# Patient Record
Sex: Male | Born: 1995 | Race: White | Hispanic: No | Marital: Single | Smoking: Current every day smoker
Health system: Southern US, Community
[De-identification: ages and names within clinical notes are randomized; demographics above are authoritative.]

---

## 2013-02-03 ENCOUNTER — Encounter (HOSPITAL_COMMUNITY): Payer: Self-pay | Admitting: Emergency Medicine

## 2013-02-03 ENCOUNTER — Emergency Department (INDEPENDENT_AMBULATORY_CARE_PROVIDER_SITE_OTHER): Payer: Self-pay

## 2013-02-03 ENCOUNTER — Emergency Department (INDEPENDENT_AMBULATORY_CARE_PROVIDER_SITE_OTHER)
Admission: EM | Admit: 2013-02-03 | Discharge: 2013-02-03 | Disposition: A | Discharge status: Home / Self Care | Payer: Self-pay | Source: Home / Self Care | Attending: Emergency Medicine | Admitting: Emergency Medicine

## 2013-02-03 DIAGNOSIS — S20219A Contusion of unspecified front wall of thorax, initial encounter: Secondary | ICD-10-CM

## 2013-02-03 DIAGNOSIS — S8000XA Contusion of unspecified knee, initial encounter: Secondary | ICD-10-CM

## 2013-02-03 MED ORDER — MELOXICAM 15 MG PO TABS: 15.0000 mg | ORAL_TABLET | Freq: Every day | ORAL | Status: DC

## 2013-02-03 MED ORDER — METHOCARBAMOL 500 MG PO TABS: 500.0000 mg | ORAL_TABLET | Freq: Three times a day (TID) | ORAL | Status: DC

## 2013-02-03 MED ORDER — TRAMADOL HCL 50 MG PO TABS: 100.0000 mg | ORAL_TABLET | Freq: Three times a day (TID) | ORAL | Status: DC | PRN

## 2013-02-03 NOTE — Discharge Instructions (Signed)
Chest Contusion °A chest contusion is a deep bruise on your chest area. Contusions are the result of an injury that caused bleeding under the skin. A chest contusion may involve bruising of the skin, muscles, or ribs. The contusion may turn blue, purple, or yellow. Minor injuries will give you a painless contusion, but more severe contusions may stay painful and swollen for a few weeks. °CAUSES  °A contusion is usually caused by a blow, trauma, or direct force to an area of the body. °SYMPTOMS  °· Swelling and redness of the injured area. °· Discoloration of the injured area. °· Tenderness and soreness of the injured area. °· Pain. °DIAGNOSIS  °The diagnosis can be made by taking a history and performing a physical exam. An X-ray, CT scan, or MRI may be needed to determine if there were any associated injuries, such as broken bones (fractures) or internal injuries. °TREATMENT  °Often, the best treatment for a chest contusion is resting, icing, and applying cold compresses to the injured area. Deep breathing exercises may be recommended to reduce the risk of pneumonia. Over-the-counter medicines may also be recommended for pain control. °HOME CARE INSTRUCTIONS  °· Put ice on the injured area. °· Put ice in a plastic bag. °· Place a towel between your skin and the bag. °· Leave the ice on for 15-20 minutes, 03-04 times a day. °· Only take over-the-counter or prescription medicines as directed by your caregiver. Your caregiver may recommend avoiding anti-inflammatory medicines (aspirin, ibuprofen, and naproxen) for 48 hours because these medicines may increase bruising. °· Rest the injured area. °· Perform deep-breathing exercises as directed by your caregiver. °· Stop smoking if you smoke. °· Do not lift objects over 5 pounds (2.3 kg) for 3 days or longer if recommended by your caregiver. °SEEK IMMEDIATE MEDICAL CARE IF:  °· You have increased bruising or swelling. °· You have pain that is getting worse. °· You have  difficulty breathing. °· You have dizziness, weakness, or fainting. °· You have blood in your urine or stool. °· You cough up or vomit blood. °· Your swelling or pain is not relieved with medicines. °MAKE SURE YOU:  °· Understand these instructions. °· Will watch your condition. °· Will get help right away if you are not doing well or get worse. °Document Released: 09/22/2000 Document Revised: 09/22/2011 Document Reviewed: 06/21/2011 °[employer]® Patient Information ©2014 [employer]. ° °
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
## 2013-10-27 ENCOUNTER — Emergency Department (HOSPITAL_COMMUNITY)
Admission: EM | Admit: 2013-10-27 | Discharge: 2013-10-27 | Disposition: A | Discharge status: Home / Self Care | Payer: Self-pay | Attending: Emergency Medicine | Admitting: Emergency Medicine

## 2013-10-27 ENCOUNTER — Encounter (HOSPITAL_COMMUNITY): Payer: Self-pay | Admitting: Emergency Medicine

## 2013-10-27 DIAGNOSIS — Z720 Tobacco use: Secondary | ICD-10-CM | POA: Insufficient documentation

## 2013-10-27 DIAGNOSIS — R319 Hematuria, unspecified: Secondary | ICD-10-CM | POA: Insufficient documentation

## 2013-10-27 LAB — URINALYSIS, ROUTINE W REFLEX MICROSCOPIC
Bilirubin Urine: NEGATIVE
GLUCOSE, UA: NEGATIVE mg/dL
Ketones, ur: NEGATIVE mg/dL
LEUKOCYTES UA: NEGATIVE
Nitrite: NEGATIVE
PH: 5.5 (ref 5.0–8.0)
PROTEIN: NEGATIVE mg/dL
SPECIFIC GRAVITY, URINE: 1.026 (ref 1.005–1.030)
Urobilinogen, UA: 0.2 mg/dL (ref 0.0–1.0)

## 2013-10-27 LAB — URINE MICROSCOPIC-ADD ON

## 2013-10-27 LAB — CBG MONITORING, ED: Glucose-Capillary: 82 mg/dL (ref 70–99)

## 2013-10-27 NOTE — ED Notes (Signed)
Per pt, states blood on tip of penis-states diesnt know if it is in urine-doesn't urinate often-last urination was yeaterday

## 2013-10-27 NOTE — ED Provider Notes (Addendum)
CSN: [id number]     Arrival date & time 10/27/13  1015 History   First MD Initiated Contact with Patient 10/27/13 1046     Chief Complaint  Patient presents with  . possible hematuria      (Consider location/radiation/quality/duration/timing/severity/associated sxs/prior Treatment) HPI Comments: Pt noticed a small amt of blood at the penile urethral meatus last night after masterbating.  He denies blood in the semen and denies rough sex.  This morning he noticed some dried blood and then a small amount of fresh oozing blood.  He denies penile pain, dysuria, abd pain, flank pain, fever or testicle pain or swelling.  This happened once when he was young but not again until now.  The history is provided by the patient.    History reviewed. No pertinent past medical history. History reviewed. No pertinent past surgical history. No family history on file. History  Substance Use Topics  . Smoking status: Current Every Day Smoker -- 1.00 packs/day    Types: Cigarettes  . Smokeless tobacco: Not on file  . Alcohol Use: Yes    Review of Systems  All other systems reviewed and are negative.     Allergies  Bee venom  Home Medications   Prior to Admission medications   Not on File   BP 134/69  Pulse 82  Temp(Src) 97.7 F (36.5 C) (Oral)  Resp 16  SpO2 100% Physical Exam  Nursing note and vitals reviewed. Constitutional: He appears well-developed and well-nourished. No distress.  HENT:  Head: Normocephalic and atraumatic.  Eyes: EOM are normal. Pupils are equal, round, and reactive to light.  Cardiovascular: Normal rate, normal heart sounds and intact distal pulses.   Pulmonary/Chest: Effort normal and breath sounds normal. No respiratory distress. He has no wheezes.  Abdominal: Soft. He exhibits no distension. There is no tenderness. There is no rebound, no guarding and no CVA tenderness.  Genitourinary: Testes normal and penis normal.  Dried blood at the urethral meatus     ED Course  Procedures (including critical care time) Labs Review Labs Reviewed  URINALYSIS, ROUTINE W REFLEX MICROSCOPIC - Abnormal; Notable for the following:    Hgb urine dipstick MODERATE (*)    All other components within normal limits  URINE MICROSCOPIC-ADD ON  CBG MONITORING, ED    Imaging Review No results found.   EKG Interpretation None      MDM   Final diagnoses:  Hematuria    Patient presents to the seeing blood on the tip of his penis that started last night after masturbation. He denies any blood in his semen. He denies any hematuria, dysuria, flank pain. He last had sex one week ago without pain or knowledge of blood in his semen. This morning he had some dried blood at urethral meatus. He denies any injury, lesions or abnormality to his male genitalia.  UA pending  12:07 PM UA with blood only.  No signs of infection.  Pt given f/u to assymptomatic hematuria   Gwyneth SproutWhitney Taris Galindo, MD 10/27/13 [id number]  Gwyneth SproutWhitney Sammie Denner, MD 10/27/13 [phone]

## 2013-10-27 NOTE — Discharge Instructions (Signed)
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
